# Patient Record
Sex: Male | Born: 1995 | Race: Black or African American | Hispanic: No | Marital: Single | Smoking: Never smoker
Health system: Southern US, Community
[De-identification: ages and names within clinical notes are randomized; demographics above are authoritative.]

## PROBLEM LIST (undated history)

## (undated) DIAGNOSIS — J45909 Unspecified asthma, uncomplicated: Secondary | ICD-10-CM

## (undated) DIAGNOSIS — F909 Attention-deficit hyperactivity disorder, unspecified type: Secondary | ICD-10-CM

---
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
## 2018-07-08 ENCOUNTER — Encounter (HOSPITAL_COMMUNITY): Payer: Self-pay | Admitting: Emergency Medicine

## 2018-07-08 ENCOUNTER — Other Ambulatory Visit: Payer: Self-pay

## 2018-07-08 ENCOUNTER — Emergency Department (HOSPITAL_COMMUNITY)
Admission: EM | Admit: 2018-07-08 | Discharge: 2018-07-08 | Disposition: A | Discharge status: Home / Self Care | Payer: Medicaid Other | Attending: Emergency Medicine | Admitting: Emergency Medicine

## 2018-07-08 DIAGNOSIS — Y92003 Bedroom of unspecified non-institutional (private) residence as the place of occurrence of the external cause: Secondary | ICD-10-CM | POA: Insufficient documentation

## 2018-07-08 DIAGNOSIS — Z79899 Other long term (current) drug therapy: Secondary | ICD-10-CM | POA: Diagnosis not present

## 2018-07-08 DIAGNOSIS — Y9384 Activity, sleeping: Secondary | ICD-10-CM | POA: Diagnosis not present

## 2018-07-08 DIAGNOSIS — W228XXA Striking against or struck by other objects, initial encounter: Secondary | ICD-10-CM | POA: Diagnosis not present

## 2018-07-08 DIAGNOSIS — Y999 Unspecified external cause status: Secondary | ICD-10-CM | POA: Diagnosis not present

## 2018-07-08 DIAGNOSIS — S0992XA Unspecified injury of nose, initial encounter: Secondary | ICD-10-CM | POA: Diagnosis present

## 2018-07-08 DIAGNOSIS — J45909 Unspecified asthma, uncomplicated: Secondary | ICD-10-CM | POA: Insufficient documentation

## 2018-07-08 MED ORDER — IBUPROFEN 800 MG PO TABS: 800.0000 mg | ORAL_TABLET | Freq: Three times a day (TID) | ORAL | 0 refills | Status: DC

## 2018-07-08 NOTE — ED Notes (Signed)
Pt given discharge instructions before he ambulated to the lobby. Pt had no concerns prior to discharge.

## 2018-07-08 NOTE — ED Triage Notes (Signed)
Called for triage x1, no answer 
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
## 2018-07-08 NOTE — ED Notes (Signed)
ED Provider at bedside. 

## 2018-11-14 ENCOUNTER — Ambulatory Visit (HOSPITAL_COMMUNITY)
Admission: EM | Admit: 2018-11-14 | Discharge: 2018-11-14 | Disposition: A | Discharge status: Home / Self Care | Payer: BLUE CROSS/BLUE SHIELD | Attending: Family Medicine | Admitting: Family Medicine

## 2018-11-14 ENCOUNTER — Encounter (HOSPITAL_COMMUNITY): Payer: Self-pay

## 2018-11-14 ENCOUNTER — Telehealth (HOSPITAL_COMMUNITY): Payer: Self-pay

## 2018-11-14 DIAGNOSIS — R369 Urethral discharge, unspecified: Secondary | ICD-10-CM | POA: Insufficient documentation

## 2018-11-14 MED ORDER — CEFTRIAXONE SODIUM 250 MG IJ SOLR
INTRAMUSCULAR | Status: AC
  Filled 2018-11-14: qty 250

## 2018-11-14 MED ORDER — CEFTRIAXONE SODIUM 250 MG IJ SOLR
250.0000 mg | Freq: Once | INTRAMUSCULAR | Status: DC
  Administered 2018-11-14: 250 mg via INTRAMUSCULAR

## 2018-11-14 MED ORDER — AZITHROMYCIN 250 MG PO TABS
1000.0000 mg | ORAL_TABLET | Freq: Once | ORAL | Status: DC
  Administered 2018-11-14: 1000 mg via ORAL

## 2018-11-14 MED ORDER — AZITHROMYCIN 250 MG PO TABS
ORAL_TABLET | ORAL | Status: AC
  Filled 2018-11-14: qty 4

## 2018-11-14 NOTE — Discharge Instructions (Signed)
You were treated empirically for gonorrhea and chlamydia. Azithromycin 1g by mouth and Rocephin 250mg  injection given in office today. Cytology sent, you will be contacted with any positive results that requires further treatment. Refrain from sexual activity and alcohol use for the next 7 days. If experiencing penile lesion/sores, testicular swelling/pain, follow up for reevaluation needed.

## 2018-11-14 NOTE — ED Provider Notes (Signed)
[facility]    CSN: [id number] Arrival date & time: 11/14/18  [id number]     History   Chief Complaint Chief Complaint  Patient presents with  . Exposure to STD    HPI Steven Beck is a 23 y.o. male.   23 year old male comes in for 1 week history of penile discharge.  Denies fever, chills, night sweats.  Denies dysuria, hematuria, frequency.  Denies penile lesion/sore, testicular swelling, testicular pain.  Sexually active with one male partner, no condom use.     Past Medical History:  Diagnosis Date  . ADHD (attention deficit hyperactivity disorder)   . Asthma     There are no active problems to display for this patient.   History reviewed. No pertinent surgical history.     Home Medications    Prior to Admission medications   Medication Sig Start Date End Date Taking? Authorizing Provider  cetirizine (ZYRTEC) 10 MG tablet Take 10 mg by mouth daily.    [provider]  ibuprofen (ADVIL,MOTRIN) 800 MG tablet Take 1 tablet (800 mg total) by mouth 3 (three) times daily. 07/08/18   Arby BarrettePfeiffer, Marcy, MD  methylphenidate 36 MG PO CR tablet Take 36 mg by mouth daily.    [provider]  mometasone-formoterol (DULERA) 100-5 MCG/ACT AERO Inhale 2 puffs into the lungs 2 (two) times daily.    [provider]  omeprazole (PRILOSEC) 20 MG capsule Take 20 mg by mouth daily.    [provider]  ondansetron (ZOFRAN ODT) 4 MG disintegrating tablet Take 1 tablet (4 mg total) by mouth every 8 (eight) hours as needed for nausea. 05/02/14   Latrelle DodrillMcIntyre, Brittany J, MD    Family History History reviewed. No pertinent family history.  Social History Social History   Tobacco Use  . Smoking status: Never Smoker  . Smokeless tobacco: Never Used  Substance Use Topics  . Alcohol use: Never    Frequency: Never  . Drug use: Never     Allergies   Patient has no known allergies.   Review of Systems Review of Systems  Reason unable to  perform ROS: See HPI as above.     Physical Exam Triage Vital Signs ED Triage Vitals  Enc Vitals Group     BP 11/14/18 0843 135/70     Pulse Rate 11/14/18 0843 61     Resp 11/14/18 0843 14     Temp 11/14/18 0843 98.2 F (36.8 C)     Temp Source 11/14/18 0843 Oral     SpO2 11/14/18 0843 100 %     Weight --      Height --      Head Circumference --      Peak Flow --      Pain Score 11/14/18 0848 0     Pain Loc --      Pain Edu? --      Excl. in GC? --    No data found.  Updated Vital Signs BP 135/70 (BP Location: Left Arm)   Pulse 61   Temp 98.2 F (36.8 C) (Oral)   Resp 14   SpO2 100%   Physical Exam Constitutional:      General: He is not in acute distress.    Appearance: He is well-developed. He is not diaphoretic.  HENT:     Head: Normocephalic and atraumatic.  Eyes:     Conjunctiva/sclera: Conjunctivae normal.     Pupils: Pupils are equal, round, and reactive to light.  Neurological:     Mental Status: He is alert and oriented to person, place, and time.      UC Treatments / Results  Labs (all labs ordered are listed, but only abnormal results are displayed) Labs Reviewed  URINE CYTOLOGY ANCILLARY ONLY    EKG None  Radiology No results found.  Procedures Procedures (including critical care time)  Medications Ordered in UC Medications - No data to display  Initial Impression / Assessment and Plan / UC Course  I have reviewed the triage vital signs and the nursing notes.  Pertinent labs & imaging results that were available during my care of the patient were reviewed by me and considered in my medical decision making (see chart for details).    Patient was treated empirically for GC. Azithromycin and Rocephin given in office today. Cytology sent, patient will be contacted with any positive results that require additional treatment. Patient to refrain from sexual activity for the next 7 days. Return precautions given.   Final Clinical  Impressions(s) / UC Diagnoses   Final diagnoses:  Penile discharge    ED Prescriptions    None        Belinda FisherYu, Danile Trier V, PA-C 11/14/18 1436

## 2018-11-14 NOTE — ED Triage Notes (Signed)
Pt presents for STD Testing; pt has complaints of penile discharge with no pain or irritation.

## 2018-11-17 ENCOUNTER — Telehealth (HOSPITAL_COMMUNITY): Payer: Self-pay | Admitting: Emergency Medicine

## 2018-11-17 LAB — URINE CYTOLOGY ANCILLARY ONLY
Chlamydia: NEGATIVE
Neisseria Gonorrhea: POSITIVE — AB
Trichomonas: NEGATIVE

## 2018-11-17 NOTE — Telephone Encounter (Signed)
Test for gonorrhea was positive. This was treated at the urgent care visit with IM rocephin 250mg  and po zithromax 1g. Pt needs education to refrain from sexual intercourse for 7 days after treatment to give the medicine time to work. Sexual partners need to be notified and tested/treated. Condoms may reduce risk of reinfection. Recheck or followup with PCP for further evaluation if symptoms are not improving. [facility] notified.   Attempted to call pt did not answer and no voice mail set up

## 2018-11-17 NOTE — Telephone Encounter (Signed)
Spoke with pt verbalized understanding of test results °

## 2018-12-30 ENCOUNTER — Emergency Department (HOSPITAL_COMMUNITY)
Admission: EM | Admit: 2018-12-30 | Discharge: 2018-12-30 | Disposition: A | Discharge status: Home / Self Care | Payer: BLUE CROSS/BLUE SHIELD | Attending: Emergency Medicine | Admitting: Emergency Medicine

## 2018-12-30 ENCOUNTER — Encounter (HOSPITAL_COMMUNITY): Payer: Self-pay | Admitting: Emergency Medicine

## 2018-12-30 ENCOUNTER — Other Ambulatory Visit: Payer: Self-pay

## 2018-12-30 DIAGNOSIS — F909 Attention-deficit hyperactivity disorder, unspecified type: Secondary | ICD-10-CM | POA: Insufficient documentation

## 2018-12-30 DIAGNOSIS — Z79899 Other long term (current) drug therapy: Secondary | ICD-10-CM | POA: Insufficient documentation

## 2018-12-30 DIAGNOSIS — R369 Urethral discharge, unspecified: Secondary | ICD-10-CM

## 2018-12-30 DIAGNOSIS — J45909 Unspecified asthma, uncomplicated: Secondary | ICD-10-CM | POA: Insufficient documentation

## 2018-12-30 LAB — URINALYSIS, ROUTINE W REFLEX MICROSCOPIC
Bilirubin Urine: NEGATIVE
Glucose, UA: NEGATIVE mg/dL
Hgb urine dipstick: NEGATIVE
Ketones, ur: NEGATIVE mg/dL
Leukocytes,Ua: NEGATIVE
Nitrite: NEGATIVE
Protein, ur: NEGATIVE mg/dL
Specific Gravity, Urine: 1.015 (ref 1.005–1.030)
pH: 6 (ref 5.0–8.0)

## 2018-12-30 MED ORDER — STERILE WATER FOR INJECTION IJ SOLN
INTRAMUSCULAR | Status: AC
  Filled 2018-12-30: qty 10

## 2018-12-30 MED ORDER — AZITHROMYCIN 250 MG PO TABS
1000.0000 mg | ORAL_TABLET | Freq: Once | ORAL | Status: AC
  Administered 2018-12-30: 1000 mg via ORAL
  Filled 2018-12-30: qty 4

## 2018-12-30 MED ORDER — CEFTRIAXONE SODIUM 250 MG IJ SOLR
250.0000 mg | Freq: Once | INTRAMUSCULAR | Status: AC
Start: 2018-12-30 — End: 2018-12-30
  Administered 2018-12-30: 250 mg via INTRAMUSCULAR
  Filled 2018-12-30: qty 250

## 2018-12-30 NOTE — Discharge Instructions (Signed)
You have been tested for chlamydia and gonorrhea.  These results will be available in approximately 3 days and you will be contacted by the hospital if the results are positive. Avoid sexual contact until you are aware of the results, and please inform all sexual partners if you test positive for any of these diseases.  Please follow up with your primary care provider within 5-7 days for re-evaluation of your symptoms.Please return to the emergency department for any new or worsening symptoms.

## 2018-12-30 NOTE — ED Triage Notes (Signed)
Pt. Stated, Ive had some penile discharge that started about 4 days ago.
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
## 2018-12-30 NOTE — ED Notes (Signed)
Patient verbalized understanding of discharge instructions and denies any further needs or questions at this time. VS stable. Patient ambulatory with steady gait.  

## 2019-01-01 LAB — GC/CHLAMYDIA PROBE AMP (~~LOC~~) NOT AT ARMC
Chlamydia: NEGATIVE
Neisseria Gonorrhea: POSITIVE — AB

## 2020-03-05 ENCOUNTER — Emergency Department (HOSPITAL_COMMUNITY)
Admission: EM | Admit: 2020-03-05 | Discharge: 2020-03-06 | Disposition: A | Discharge status: Home / Self Care | Payer: Self-pay | Attending: Emergency Medicine | Admitting: Emergency Medicine

## 2020-03-05 ENCOUNTER — Other Ambulatory Visit: Payer: Self-pay

## 2020-03-05 ENCOUNTER — Encounter (HOSPITAL_COMMUNITY): Payer: Self-pay | Admitting: Emergency Medicine

## 2020-03-05 DIAGNOSIS — R109 Unspecified abdominal pain: Secondary | ICD-10-CM | POA: Insufficient documentation

## 2020-03-05 DIAGNOSIS — R42 Dizziness and giddiness: Secondary | ICD-10-CM | POA: Insufficient documentation

## 2020-03-05 DIAGNOSIS — R1110 Vomiting, unspecified: Secondary | ICD-10-CM | POA: Insufficient documentation

## 2020-03-05 DIAGNOSIS — Z5321 Procedure and treatment not carried out due to patient leaving prior to being seen by health care provider: Secondary | ICD-10-CM | POA: Insufficient documentation

## 2020-03-05 LAB — URINALYSIS, ROUTINE W REFLEX MICROSCOPIC
Bilirubin Urine: NEGATIVE
Glucose, UA: NEGATIVE mg/dL
Hgb urine dipstick: NEGATIVE
Ketones, ur: 20 mg/dL — AB
Leukocytes,Ua: NEGATIVE
Nitrite: NEGATIVE
Protein, ur: NEGATIVE mg/dL
Specific Gravity, Urine: 1.016 (ref 1.005–1.030)
pH: 5 (ref 5.0–8.0)

## 2020-03-05 LAB — COMPREHENSIVE METABOLIC PANEL
ALT: 19 U/L (ref 0–44)
AST: 22 U/L (ref 15–41)
Albumin: 4.7 g/dL (ref 3.5–5.0)
Alkaline Phosphatase: 45 U/L (ref 38–126)
Anion gap: 12 (ref 5–15)
BUN: 7 mg/dL (ref 6–20)
CO2: 23 mmol/L (ref 22–32)
Calcium: 9.8 mg/dL (ref 8.9–10.3)
Chloride: 105 mmol/L (ref 98–111)
Creatinine, Ser: 1.03 mg/dL (ref 0.61–1.24)
GFR calc Af Amer: >60 mL/min (ref >60)
GFR calc non Af Amer: >60 mL/min (ref >60)
Glucose, Bld: 101 mg/dL — ABNORMAL HIGH (ref 70–99)
Potassium: 4.3 mmol/L (ref 3.5–5.1)
Sodium: 140 mmol/L (ref 135–145)
Total Bilirubin: 1.5 mg/dL — ABNORMAL HIGH (ref 0.3–1.2)
Total Protein: 7.7 g/dL (ref 6.5–8.1)

## 2020-03-05 LAB — CBC
HCT: 46.2 % (ref 39.0–52.0)
Hemoglobin: 15.5 g/dL (ref 13.0–17.0)
MCH: 28.6 pg (ref 26.0–34.0)
MCHC: 33.5 g/dL (ref 30.0–36.0)
MCV: 85.2 fL (ref 80.0–100.0)
Platelets: 269 10*3/uL (ref 150–400)
RBC: 5.42 MIL/uL (ref 4.22–5.81)
RDW: 12.8 % (ref 11.5–15.5)
WBC: 12 10*3/uL — ABNORMAL HIGH (ref 4.0–10.5)
nRBC: 0 % (ref 0.0–0.2)

## 2020-03-05 LAB — LIPASE, BLOOD: Lipase: 26 U/L (ref 11–51)

## 2020-03-05 MED ORDER — SODIUM CHLORIDE 0.9% FLUSH: 3.0000 mL | Freq: Once | INTRAVENOUS | Status: DC

## 2020-03-05 NOTE — ED Triage Notes (Signed)
Patient reports mid abdominal pain with emesis , diarrhea and mild lightheaded onset this morning , denies fever or chills.

## 2020-03-06 NOTE — ED Notes (Signed)
No answer x1 for vitals recheck 

## 2020-03-06 NOTE — ED Notes (Signed)
No answer x2 

## 2020-03-06 NOTE — ED Notes (Signed)
No answer x3

## 2021-08-24 ENCOUNTER — Encounter: Payer: Self-pay | Admitting: Emergency Medicine

## 2021-08-24 ENCOUNTER — Other Ambulatory Visit: Payer: Self-pay

## 2021-08-24 ENCOUNTER — Ambulatory Visit
Admission: EM | Admit: 2021-08-24 | Discharge: 2021-08-24 | Disposition: A | Discharge status: Home / Self Care | Payer: 59 | Attending: Internal Medicine | Admitting: Internal Medicine

## 2021-08-24 ENCOUNTER — Ambulatory Visit (INDEPENDENT_AMBULATORY_CARE_PROVIDER_SITE_OTHER): Payer: 59

## 2021-08-24 DIAGNOSIS — R053 Chronic cough: Secondary | ICD-10-CM

## 2021-08-24 DIAGNOSIS — S39012A Strain of muscle, fascia and tendon of lower back, initial encounter: Secondary | ICD-10-CM

## 2021-08-24 DIAGNOSIS — M545 Low back pain, unspecified: Secondary | ICD-10-CM | POA: Diagnosis not present

## 2021-08-24 MED ORDER — ALBUTEROL SULFATE HFA 108 (90 BASE) MCG/ACT IN AERS: 2.0000 | INHALATION_SPRAY | RESPIRATORY_TRACT | 0 refills | Status: AC | PRN

## 2021-08-24 MED ORDER — IBUPROFEN 800 MG PO TABS: 800.0000 mg | ORAL_TABLET | Freq: Three times a day (TID) | ORAL | 0 refills | Status: DC

## 2021-08-24 MED ORDER — CYCLOBENZAPRINE HCL 10 MG PO TABS: 10.0000 mg | ORAL_TABLET | Freq: Three times a day (TID) | ORAL | 0 refills | Status: DC
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
## 2021-08-24 NOTE — Discharge Instructions (Addendum)
Please follow up with a pulmonologist for your cough [facility] [address] [place], [place] [address]  [place]: [phone] Fax: [phone]  For your back apply ice on area of pain for 20 minutes 4-5 times today and tomorrow, then starting 10/22 alternate with heat and do stretches after the heat as I showed you  Follow up with orthopedics or family doctor if you dont get better by Monday.
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
## 2021-09-08 ENCOUNTER — Ambulatory Visit (INDEPENDENT_AMBULATORY_CARE_PROVIDER_SITE_OTHER): Payer: Self-pay | Admitting: Pulmonary Disease

## 2021-09-08 ENCOUNTER — Encounter: Payer: Self-pay | Admitting: Pulmonary Disease

## 2021-09-08 ENCOUNTER — Other Ambulatory Visit: Payer: Self-pay

## 2021-09-08 VITALS — BP 100/70 | HR 86 | Temp 97.4°F | Ht 65.0 in | Wt 187.2 lb

## 2021-09-08 DIAGNOSIS — J4540 Moderate persistent asthma, uncomplicated: Secondary | ICD-10-CM

## 2021-09-08 MED ORDER — BUDESONIDE-FORMOTEROL FUMARATE 160-4.5 MCG/ACT IN AERO: 2.0000 | INHALATION_SPRAY | Freq: Two times a day (BID) | RESPIRATORY_TRACT | 6 refills | Status: DC

## 2021-09-08 NOTE — Progress Notes (Signed)
Patient seen in the office today and instructed on use of Symbicort.  Patient expressed understanding and demonstrated technique. ° °

## 2021-09-08 NOTE — Progress Notes (Signed)
Steven Beck    IN:[id number]    27-Jan-1996  Primary Care Physician:Patient, No Pcp Per (Inactive)  Referring Physician: No referring provider defined for this encounter.  Chief complaint: Consult for asthma  HPI: 25 year old with history of childhood asthma He has been maintained on inhalers including Dulera until the age of 61 Currently not on inhalers.  There is albuterol rescue medication on record but he is not taking it  Complains of cough, wheezing, dyspnea with daily nighttime awakenings No congestion, sputum production. He had a chest x-ray last month which did not show any acute abnormality  Pets: No pets Occupation: Set designer Exposures: No mold, hot tub, Jacuzzi.  No feather pillows or comforter Smoking history: Smokes marijuana 1-2 times a week.  His marijuana usage has been daily previously Quit cigarettes in 2020, quit vaping in 2020 Travel history: No significant travel history Relevant family history: No family history of lung disease  Outpatient Encounter Medications as of 09/08/2021  Medication Sig   albuterol (VENTOLIN HFA) 108 (90 Base) MCG/ACT inhaler Inhale 2 puffs into the lungs every 4 (four) hours as needed for wheezing or shortness of breath. (Patient not taking: Reported on 09/08/2021)   cetirizine (ZYRTEC) 10 MG tablet Take 10 mg by mouth daily. (Patient not taking: Reported on 09/08/2021)   [DISCONTINUED] cyclobenzaprine (FLEXERIL) 10 MG tablet Take 1 tablet (10 mg total) by mouth 3 (three) times daily. Prn muscle spasm. Avoid taking it if have to drive or work. (Patient not taking: Reported on 09/08/2021)   [DISCONTINUED] ibuprofen (ADVIL) 800 MG tablet Take 1 tablet (800 mg total) by mouth 3 (three) times daily. (Patient not taking: Reported on 09/08/2021)   [DISCONTINUED] methylphenidate 36 MG PO CR tablet Take 36 mg by mouth daily. (Patient not taking: Reported on 09/08/2021)   [DISCONTINUED] mometasone-formoterol (DULERA)  100-5 MCG/ACT AERO Inhale 2 puffs into the lungs 2 (two) times daily. (Patient not taking: Reported on 09/08/2021)   [DISCONTINUED] omeprazole (PRILOSEC) 20 MG capsule Take 20 mg by mouth daily. (Patient not taking: Reported on 09/08/2021)   [DISCONTINUED] ondansetron (ZOFRAN ODT) 4 MG disintegrating tablet Take 1 tablet (4 mg total) by mouth every 8 (eight) hours as needed for nausea. (Patient not taking: Reported on 09/08/2021)   No facility-administered encounter medications on file as of 09/08/2021.    Allergies as of 09/08/2021   (No Known Allergies)    Past Medical History:  Diagnosis Date   ADHD (attention deficit hyperactivity disorder)    Asthma     No past surgical history on file.  No family history on file.  Social History   Socioeconomic History   Marital status: Single    Spouse name: Not on file   Number of children: Not on file   Years of education: Not on file   Highest education level: Not on file  Occupational History   Not on file  Tobacco Use   Smoking status: Never    Passive exposure: Never   Smokeless tobacco: Never   Tobacco comments:    Hx of smoking THC.  Currently smokes THC weekly.  Substance and Sexual Activity   Alcohol use: Never   Drug use: Never   Sexual activity: Not on file  Other Topics Concern   Not on file  Social History Narrative   Not on file   Social Determinants of Health   Financial Resource Strain: Not on file  Food Insecurity: Not on file  Transportation Needs: Not on file  Physical Activity: Not on file  Stress: Not on file  Social Connections: Not on file  Intimate Partner Violence: Not on file    Review of systems: Review of Systems  Constitutional: Negative for fever and chills.  HENT: Negative.   Eyes: Negative for blurred vision.  Respiratory: as per HPI  Cardiovascular: Negative for chest pain and palpitations.  Gastrointestinal: Negative for vomiting, diarrhea, blood per rectum. Genitourinary: Negative  for dysuria, urgency, frequency and hematuria.  Musculoskeletal: Negative for myalgias, back pain and joint pain.  Skin: Negative for itching and rash.  Neurological: Negative for dizziness, tremors, focal weakness, seizures and loss of consciousness.  Endo/Heme/Allergies: Negative for environmental allergies.  Psychiatric/Behavioral: Negative for depression, suicidal ideas and hallucinations.  All other systems reviewed and are negative.  Physical Exam: Blood pressure 100/70, pulse 86, temperature (!) 97.4 F (36.3 C), temperature source Oral, height 5\' 5"  (1.651 m), weight 187 lb 3.2 oz (84.9 kg), SpO2 99 %. Gen:      No acute distress HEENT:  EOMI, sclera anicteric Neck:     No masses; no thyromegaly Lungs:    Clear to auscultation bilaterally; normal respiratory effort CV:         Regular rate and rhythm; no murmurs Abd:      + bowel sounds; soft, non-tender; no palpable masses, no distension Ext:    No edema; adequate peripheral perfusion Skin:      Warm and dry; no rash Neuro: alert and oriented x 3 Psych: normal mood and affect  Data Reviewed: Imaging: Chest x-ray 08/24/2021-no acute cardiopulmonary abnormality I have reviewed the images personally.   PFTs:  Labs:  Assessment:  Evaluation for asthma He has history of childhood asthma Not currently on inhalers and continues to have persistent daily symptoms  We discussed quitting marijuana smoking which is probably making his presentation worse Check CBC differential, IgE Start Symbicort inhaler Schedule PFTs and return to clinic  Plan/Recommendations: Labs PFTs Symbicort  08/26/2021 MD [facility] 09/08/2021, 9:23 AM  CC: No ref. provider found

## 2021-09-08 NOTE — Addendum Note (Signed)
Addended by: Delrae Rend on: 09/08/2021 01:20 PM   Modules accepted: Orders

## 2021-09-08 NOTE — Patient Instructions (Signed)
We will get CBC differential, IgE today Check PFTs Start Symbicort 160.  Take 2 puffs twice daily Work on smoking cessation  Follow-up in 1 to 2 months

## 2021-09-11 ENCOUNTER — Telehealth: Payer: Self-pay | Admitting: Pulmonary Disease

## 2021-09-11 NOTE — Telephone Encounter (Signed)
disregard

## 2021-10-20 ENCOUNTER — Ambulatory Visit (INDEPENDENT_AMBULATORY_CARE_PROVIDER_SITE_OTHER): Payer: Self-pay | Admitting: Pulmonary Disease

## 2021-10-20 ENCOUNTER — Other Ambulatory Visit: Payer: Self-pay

## 2021-10-20 ENCOUNTER — Encounter: Payer: Self-pay | Admitting: Pulmonary Disease

## 2021-10-20 VITALS — BP 116/68 | HR 57 | Temp 98.1°F | Ht 66.0 in | Wt 180.4 lb

## 2021-10-20 DIAGNOSIS — J4540 Moderate persistent asthma, uncomplicated: Secondary | ICD-10-CM

## 2021-10-20 LAB — CBC WITH DIFFERENTIAL/PLATELET
Basophils Absolute: 0 10*3/uL (ref 0.0–0.1)
Basophils Relative: 0.6 % (ref 0.0–3.0)
Eosinophils Absolute: 0.1 10*3/uL (ref 0.0–0.7)
Eosinophils Relative: 2.6 % (ref 0.0–5.0)
HCT: 43.2 % (ref 39.0–52.0)
Hemoglobin: 14.3 g/dL (ref 13.0–17.0)
Lymphocytes Relative: 40.1 % (ref 12.0–46.0)
Lymphs Abs: 2.1 10*3/uL (ref 0.7–4.0)
MCHC: 33 g/dL (ref 30.0–36.0)
MCV: 86.1 fl (ref 78.0–100.0)
Monocytes Absolute: 0.8 10*3/uL (ref 0.1–1.0)
Monocytes Relative: 15.7 % — ABNORMAL HIGH (ref 3.0–12.0)
Neutro Abs: 2.2 10*3/uL (ref 1.4–7.7)
Neutrophils Relative %: 41 % — ABNORMAL LOW (ref 43.0–77.0)
Platelets: 261 10*3/uL (ref 150.0–400.0)
RBC: 5.02 Mil/uL (ref 4.22–5.81)
RDW: 13.9 % (ref 11.5–15.5)
WBC: 5.3 10*3/uL (ref 4.0–10.5)

## 2021-10-20 MED ORDER — TRELEGY ELLIPTA 200-62.5-25 MCG/ACT IN AEPB: 1.0000 | INHALATION_SPRAY | Freq: Every day | RESPIRATORY_TRACT | 2 refills | Status: AC

## 2021-10-20 MED ORDER — MONTELUKAST SODIUM 10 MG PO TABS: 10.0000 mg | ORAL_TABLET | Freq: Every day | ORAL | 5 refills | Status: AC

## 2021-10-20 NOTE — Progress Notes (Signed)
° °      °  TRICIA OAXACA    [id number]    [birth year]  Primary Care Physician:Patient, No Pcp Per (Inactive)  Referring Physician: No referring provider defined for this encounter.  Chief complaint: Follow up for asthma  HPI: 25 year old with history of childhood asthma He has been maintained on inhalers including Dulera until the age of 48 Currently not on inhalers.  There is albuterol rescue medication on record but he is not taking it  Complains of cough, wheezing, dyspnea with daily nighttime awakenings No congestion, sputum production.  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  No feather pillows or comforter Smoking history: Smokes marijuana 1-2 times a week.  His marijuana usage has been daily previously Quit cigarettes in 2020, quit vaping in 2020 Travel history: No significant travel history Relevant family history: No family history of lung disease  Interim history: Started on Symbicort at last visit.  He does not note any difference in breathing with this medication.  Continues to have cough, wheezing and dyspnea  Outpatient Encounter Medications as of 10/20/2021  Medication Sig   albuterol (VENTOLIN HFA) 108 (90 Base) MCG/ACT inhaler Inhale 2 puffs into the lungs every 4 (four) hours as needed for wheezing or shortness of breath.   budesonide-formoterol (SYMBICORT) 160-4.5 MCG/ACT inhaler Inhale 2 puffs into the lungs 2 (two) times daily.   cetirizine (ZYRTEC) 10 MG tablet Take 10 mg by mouth daily.   No facility-administered encounter medications on file as of 10/20/2021.    Physical Exam: Blood pressure 116/68, pulse (!) 57, temperature 98.1 F (36.7 C), temperature source Oral, height 5\' 6"  (1.676 m), weight 180 lb 6.4 oz (81.8 kg), SpO2 98 %. Gen:      No acute distress HEENT:  EOMI, sclera anicteric Neck:     No masses; no thyromegaly Lungs:    Clear to auscultation bilaterally; normal respiratory  effort CV:         Regular rate and rhythm; no murmurs Abd:      + bowel sounds; soft, non-tender; no palpable masses, no distension Ext:    No edema; adequate peripheral perfusion Skin:      Warm and dry; no rash Neuro: alert and oriented x 3 Psych: normal mood and affect   Data Reviewed: Imaging: Chest x-ray 08/24/2021-no acute cardiopulmonary abnormality I have reviewed the images personally  PFTs:  Labs:  Assessment:  Evaluation for asthma He has history of childhood asthma  We discussed quitting marijuana smoking which is probably making his presentation worse CBC differential, IgE and PFTs ordered at prior visit but have not been completed yet.  We will repeat these orders As he continues to be symptomatic we will change Symbicort to Trelegy, start Singulair  If he continues to be symptomatic then consider biologics  Plan/Recommendations: Change Symbicort to Trelegy Start Singulair CBC, IgE  08/26/2021 MD [facility] 10/20/2021, 9:03 AM  CC: No ref. provider found

## 2021-10-20 NOTE — Patient Instructions (Signed)
°  We will stop the Symbicort and start you on Trelegy 200 Start Singulair Check CBC differential, IgE Schedule PFTs Follow-up in 1 to 2 months

## 2021-10-23 LAB — IGE: IgE (Immunoglobulin E), Serum: 98 kU/L (ref <=114)

## 2021-11-17 ENCOUNTER — Other Ambulatory Visit: Payer: Self-pay

## 2021-11-23 ENCOUNTER — Encounter: Payer: Self-pay | Admitting: Pulmonary Disease

## 2021-11-23 ENCOUNTER — Other Ambulatory Visit: Payer: Self-pay

## 2021-11-23 ENCOUNTER — Ambulatory Visit (INDEPENDENT_AMBULATORY_CARE_PROVIDER_SITE_OTHER): Payer: Self-pay | Admitting: Pulmonary Disease

## 2021-11-23 VITALS — BP 140/80 | HR 68 | Temp 98.8°F | Ht 66.0 in | Wt 185.0 lb

## 2021-11-23 DIAGNOSIS — J4540 Moderate persistent asthma, uncomplicated: Secondary | ICD-10-CM

## 2021-11-23 MED ORDER — PROMETHAZINE-DM 6.25-15 MG/5ML PO SYRP: 5.0000 mL | ORAL_SOLUTION | Freq: Four times a day (QID) | ORAL | 0 refills | Status: AC | PRN

## 2021-11-23 MED ORDER — PANTOPRAZOLE SODIUM 40 MG PO TBEC: 40.0000 mg | DELAYED_RELEASE_TABLET | Freq: Every day | ORAL | 5 refills | Status: AC

## 2021-11-23 NOTE — Patient Instructions (Signed)
We will try promethazine for nausea and cough Continue Trelegy and Singulair You already have lung function testing follow-up scheduled with me

## 2021-11-23 NOTE — Progress Notes (Signed)
° °      °  RAYANSH HERBST    [id number]    [birth year]  Primary Care Physician:Patient, No Pcp Per (Inactive)  Referring Physician: No referring provider defined for this encounter.  Chief complaint: Follow up for asthma  HPI: 26 year old with history of childhood asthma He has been maintained on inhalers including Dulera until the age of 70 Currently not on inhalers.  There is albuterol rescue medication on record but he is not taking it  Complains of cough, wheezing, dyspnea with daily nighttime awakenings No congestion, sputum production.  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  No feather pillows or comforter Smoking history: Smokes marijuana 1-2 times a week.  His marijuana usage has been daily previously Quit cigarettes in 2020, quit vaping in 2020 Travel history: No significant travel history Relevant family history: No family history of lung disease  Interim history Initially tried on Symbicort which did not make a difference with his breathing and then changed to Trelegy but he continues to be short of breath He is on Singulair as well  PFTs had to be rescheduled   Outpatient Encounter Medications as of 11/23/2021  Medication Sig   Fluticasone-Umeclidin-Vilant (TRELEGY ELLIPTA) 200-62.5-25 MCG/ACT AEPB Inhale 1 puff into the lungs daily.   albuterol (VENTOLIN HFA) 108 (90 Base) MCG/ACT inhaler Inhale 2 puffs into the lungs every 4 (four) hours as needed for wheezing or shortness of breath. (Patient not taking: Reported on 11/23/2021)   cetirizine (ZYRTEC) 10 MG tablet Take 10 mg by mouth daily. (Patient not taking: Reported on 11/23/2021)   montelukast (SINGULAIR) 10 MG tablet Take 1 tablet (10 mg total) by mouth at bedtime. (Patient not taking: Reported on 11/23/2021)   [DISCONTINUED] budesonide-formoterol (SYMBICORT) 160-4.5 MCG/ACT inhaler Inhale 2 puffs into the lungs 2 (two) times daily.   No  facility-administered encounter medications on file as of 11/23/2021.    Physical Exam: Blood pressure 140/80, pulse 68, temperature 98.8 F (37.1 C), temperature source Oral, height 5\' 6"  (1.676 m), weight 185 lb (83.9 kg), SpO2 99 %. Gen:      No acute distress HEENT:  EOMI, sclera anicteric Neck:     No masses; no thyromegaly Lungs:    Clear to auscultation bilaterally; normal respiratory effort CV:         Regular rate and rhythm; no murmurs Abd:      + bowel sounds; soft, non-tender; no palpable masses, no distension Ext:    No edema; adequate peripheral perfusion Skin:      Warm and dry; no rash Neuro: alert and oriented x 3 Psych: normal mood and affect   Data Reviewed: Imaging: Chest x-ray 08/24/2021-no acute cardiopulmonary abnormality I have reviewed the images personally  PFTs:  Labs: CBC 10/20/2021-WBC 5.3, eos 2.6%, absolute eosinophil count 138 IgE 10/21/2019 2-98  Assessment:  Evaluation for asthma, persistent cough He has history of childhood asthma  We discussed quitting marijuana smoking which is probably making his presentation worse He is on Trelegy and Singulair but continues to be symptomatic We will try Promethazine DM for cough and Protonix for cough PFTs are pending  If he continues to be symptomatic then consider biologics  Plan/Recommendations: Trelegy, Singulair Promethazine DM, Protonix PFTs and follow-up in clinic  09-23-2003 MD [facility] 11/23/2021, 3:59 PM  CC: No ref. provider found

## 2021-12-07 ENCOUNTER — Ambulatory Visit (INDEPENDENT_AMBULATORY_CARE_PROVIDER_SITE_OTHER): Payer: Self-pay | Admitting: Pulmonary Disease

## 2021-12-07 ENCOUNTER — Other Ambulatory Visit: Payer: Self-pay | Admitting: Pulmonary Disease

## 2021-12-07 ENCOUNTER — Other Ambulatory Visit: Payer: Self-pay

## 2021-12-07 DIAGNOSIS — J4540 Moderate persistent asthma, uncomplicated: Secondary | ICD-10-CM

## 2021-12-07 LAB — PULMONARY FUNCTION TEST
DL/VA % pred: 108 %
DL/VA: 5.6 ml/min/mmHg/L
DLCO cor % pred: 102 %
DLCO cor: 29.88 ml/min/mmHg
DLCO unc % pred: 102 %
DLCO unc: 29.88 ml/min/mmHg
FEF 25-75 Post: 2.92 L/sec
FEF 25-75 Pre: 2.82 L/sec
FEF2575-%Change-Post: 3 %
FEF2575-%Pred-Post: 71 %
FEF2575-%Pred-Pre: 69 %
FEV1-%Change-Post: 0 %
FEV1-%Pred-Post: 97 %
FEV1-%Pred-Pre: 97 %
FEV1-Post: 3.4 L
FEV1-Pre: 3.39 L
FEV1FVC-%Change-Post: 3 %
FEV1FVC-%Pred-Pre: 91 %
FEV6-%Change-Post: -2 %
FEV6-%Pred-Post: 104 %
FEV6-%Pred-Pre: 107 %
FEV6-Post: 4.24 L
FEV6-Pre: 4.36 L
FEV6FVC-%Pred-Post: 100 %
FEV6FVC-%Pred-Pre: 100 %
FVC-%Change-Post: -2 %
FVC-%Pred-Post: 103 %
FVC-%Pred-Pre: 106 %
FVC-Post: 4.24 L
FVC-Pre: 4.36 L
Post FEV1/FVC ratio: 80 %
Post FEV6/FVC ratio: 100 %
Pre FEV1/FVC ratio: 78 %
Pre FEV6/FVC Ratio: 100 %
RV % pred: 106 %
RV: 1.36 L
TLC % pred: 93 %
TLC: 5.68 L

## 2021-12-07 NOTE — Patient Instructions (Signed)
Full PFT performed today. °

## 2021-12-07 NOTE — Progress Notes (Signed)
Full PFT performed today. °

## 2021-12-15 ENCOUNTER — Ambulatory Visit: Payer: 59 | Admitting: Pulmonary Disease

## 2021-12-28 ENCOUNTER — Ambulatory Visit: Payer: 59 | Admitting: Pulmonary Disease
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
## 2022-07-26 ENCOUNTER — Ambulatory Visit: Payer: 59 | Admitting: Pulmonary Disease
